# Patient Record
Sex: Female | Born: 2000 | Race: White | Hispanic: No | Marital: Single | Smoking: Never smoker
Health system: Southern US, Community
[De-identification: ages and names within clinical notes are randomized; demographics above are authoritative.]

## PROBLEM LIST (undated history)

## (undated) DIAGNOSIS — D509 Iron deficiency anemia, unspecified: Secondary | ICD-10-CM

## (undated) DIAGNOSIS — D649 Anemia, unspecified: Secondary | ICD-10-CM

---
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
## 2023-02-21 IMAGING — CT CT RENAL STONE PROTOCOL
2 of 4 series · 16 of 46 positions shown, 18 images · non-contrast
Comparison: April 16, 2021.

CLINICAL DATA: Acute lower abdominal pain.



[Series 2: axial st · axial · 0.59mm/px · z∈[-518,-88]mm · 13 of 98 slices shown, 15 images]
[im 6/98  soft-tissue]
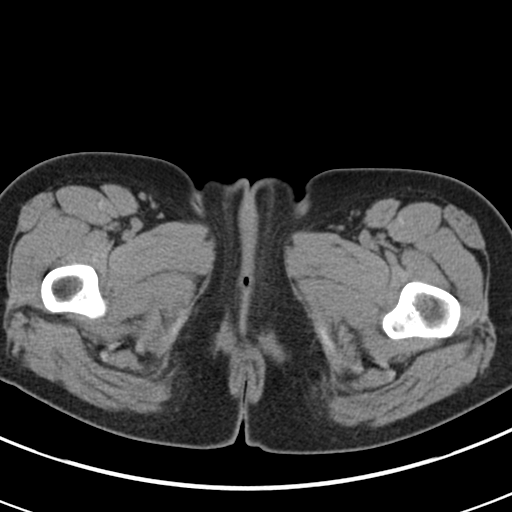
[im 6/98  bone]
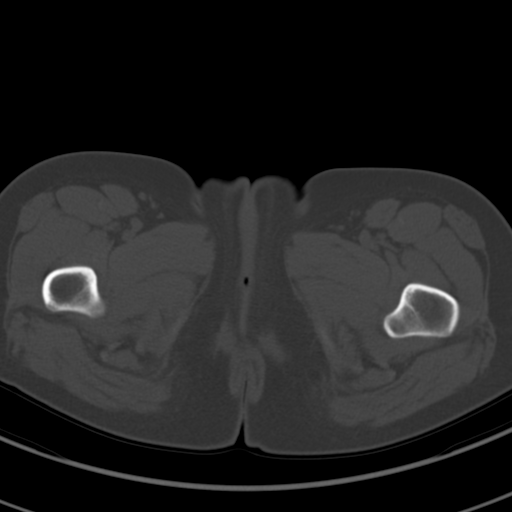
[im 11/98  soft-tissue]
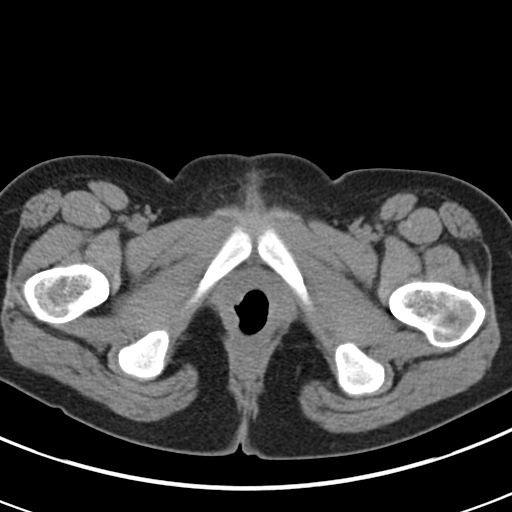
[im 22/98  soft-tissue]
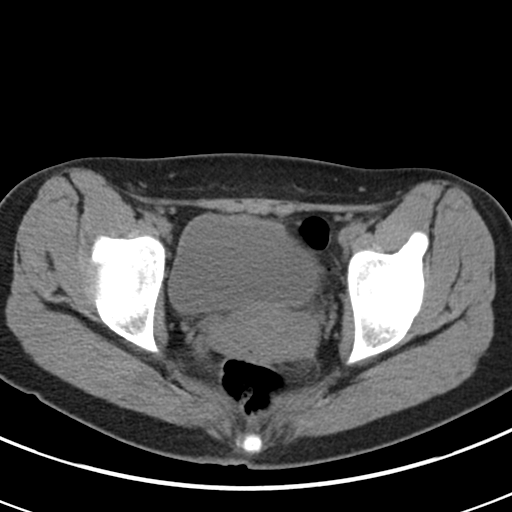
[im 27/98  soft-tissue]
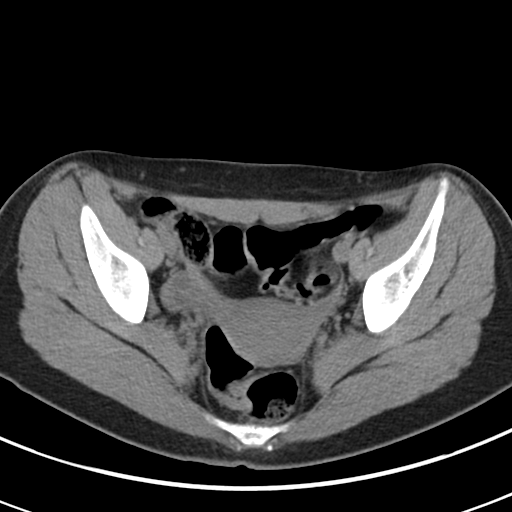
[im 33/98  soft-tissue]
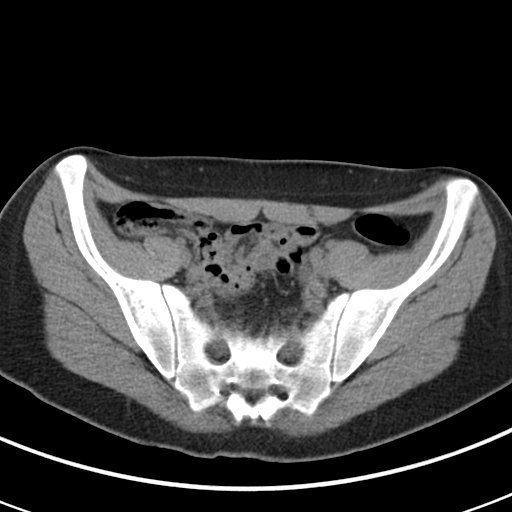
[im 44/98  soft-tissue]
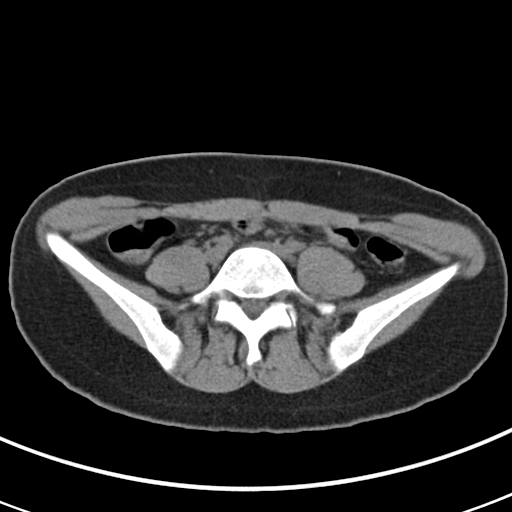
[im 49/98  soft-tissue]
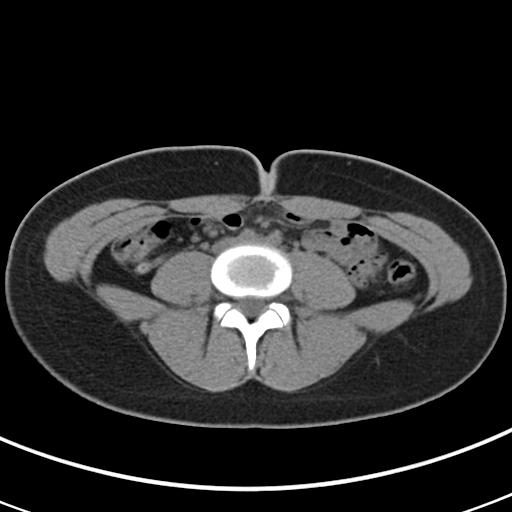
[im 54/98  soft-tissue]
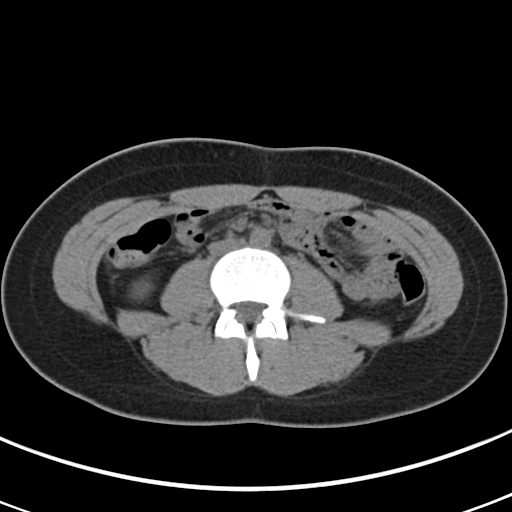
[im 65/98  soft-tissue]
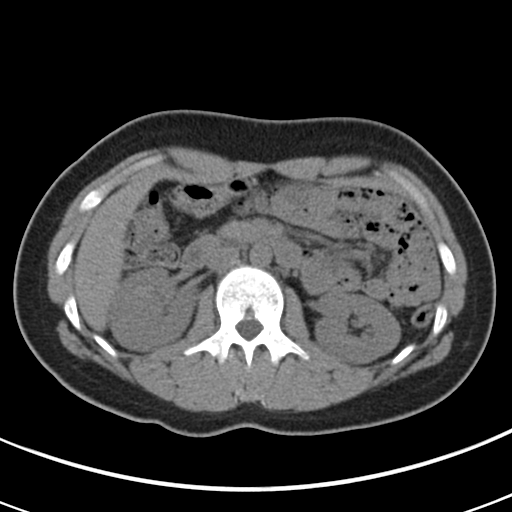
[im 65/98  bone]
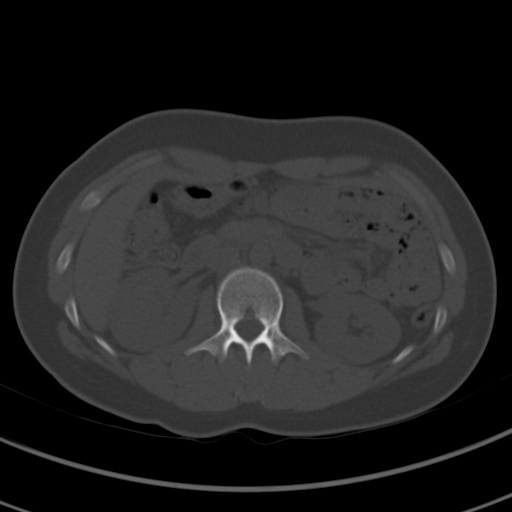
[im 71/98  soft-tissue]
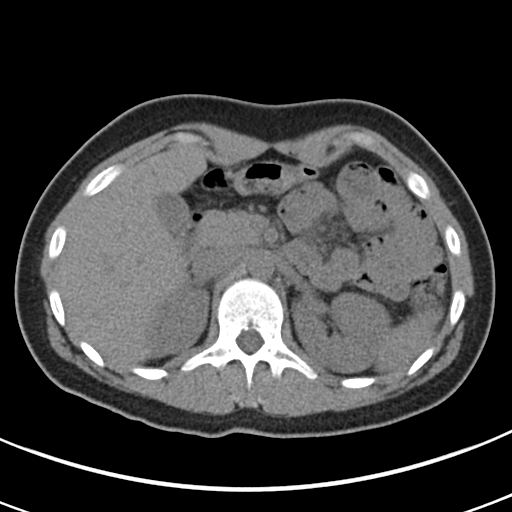
[im 76/98  soft-tissue]
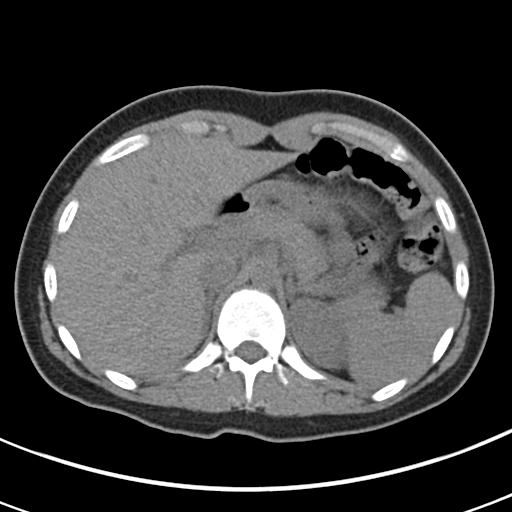
[im 87/98  soft-tissue]
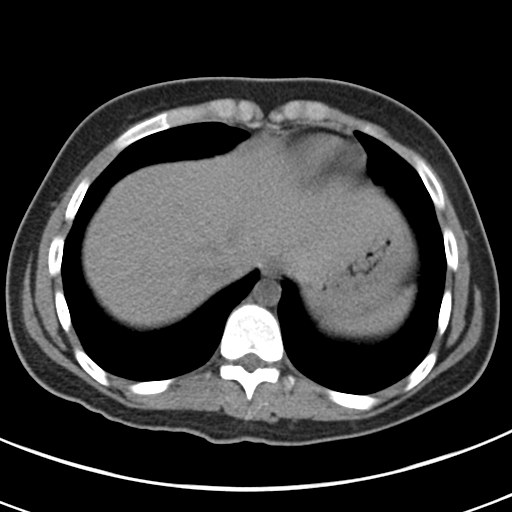
[im 92/98  soft-tissue]
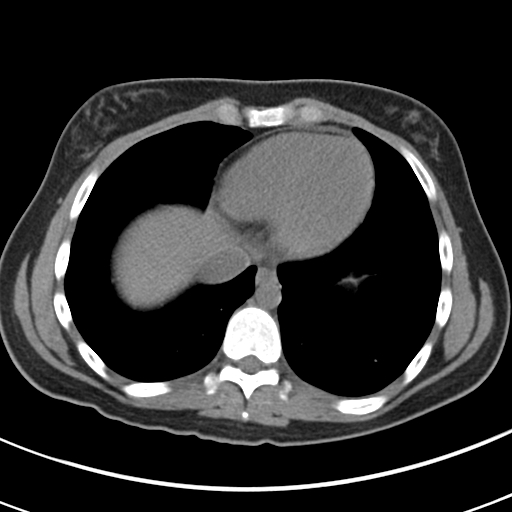

[Series 5: coronal · coronal · 0.66mm/px · 3 of 115 slices shown]
[im 39/115  soft-tissue]
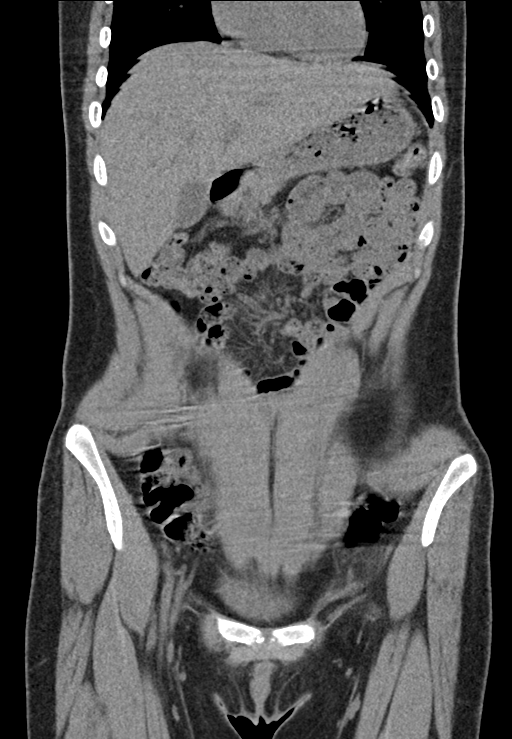
[im 51/115  soft-tissue]
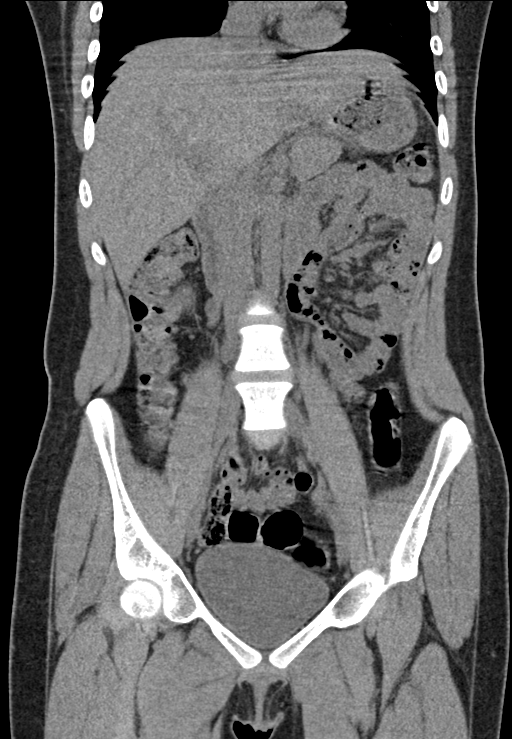
[im 64/115  soft-tissue]
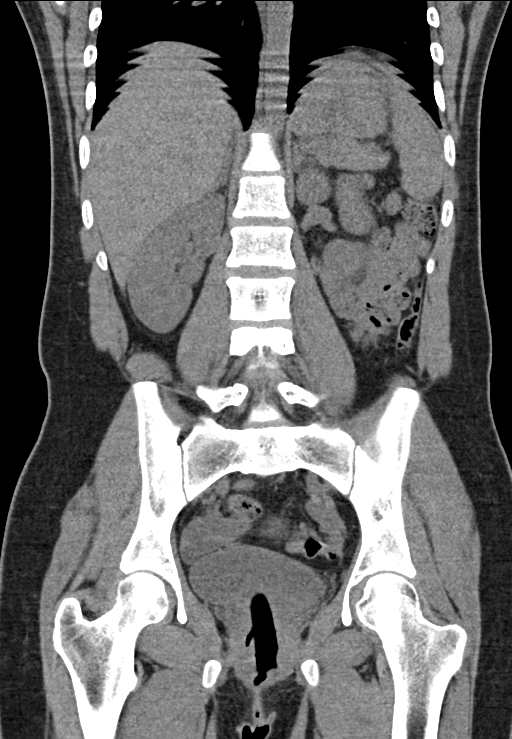

[16 of 46 positions shown; findings below may reference images not displayed]

FINDINGS: Lower chest: No acute abnormality.

Hepatobiliary: No focal liver abnormality is seen. No gallstones,
gallbladder wall thickening, or biliary dilatation.

Pancreas: Unremarkable. No pancreatic ductal dilatation or
surrounding inflammatory changes.

Spleen: Normal in size without focal abnormality.

Adrenals/Urinary Tract: Adrenal glands are unremarkable. Kidneys are
normal, without renal calculi, focal lesion, or hydronephrosis.
Bladder is unremarkable.

Stomach/Bowel: Stomach is within normal limits. Appendix appears
normal. No evidence of bowel wall thickening, distention, or
inflammatory changes.

Vascular/Lymphatic: No significant vascular findings are present. No
enlarged abdominal or pelvic lymph nodes.

Reproductive: Uterus and bilateral adnexa are unremarkable.

Other: No abdominal wall hernia or abnormality. No abdominopelvic
ascites.

Musculoskeletal: No acute or significant osseous findings.
IMPRESSION: No definite abnormality seen in the abdomen or pelvis.

## 2023-03-26 NOTE — Progress Notes (Signed)
 Subjective   HPI:  Bailey Webster is a 23 y.o.  female who presents to the office with:  Chief Complaint  Patient presents with   Fever    Patient presents today with concerns of fever, sweating, cough, and raspy voice. Onset yesterday. Manager positive at home covid/Flu A&B test.    Patient presents today complaining of fevers, chills, sweats, headaches, body aches, congestion, and cough.  She did not feel very good last night, however woke up and felt better so she went to work.  While there today she started developing more symptoms.  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  She has been taking Tylenol and NyQuil as needed.  No ear pain, sore throat, chest pain, dyspnea, wheezing, GI upset.    PMH: Past medical history, Past surgical history, Social history, family history were reviewed as noted in EMR.  Pertinent for:  Past Medical History:  Diagnosis Date   Pott's disease      Medications and allergies reviewed.  ROS: All systems were negative including constitutional, CV, respiratory, GI/GU except for those noted in the HPI.  Objective   Vital Signs: BP 123/82 (BP Location: Left Upper Arm, Patient Position: Sitting)   Pulse 117   Temp 98.4 F (36.9 C) (Temporal)   Resp 16   Ht 5' 6 (1.676 m)   Wt 184 lb (83.5 kg)   SpO2 99%   BMI 29.70 kg/m   Wt Readings from Last 3 Encounters:  03/26/23 184 lb (83.5 kg)  02/18/23 185 lb (83.9 kg)  09/18/22 173 lb 6.4 oz (78.7 kg)   Physical Exam   Assessment/Plan   Orders Placed This Encounter  Procedures   COVID-19, NAA/PCR Nasopharyngeal Swab Nasopharyngeal Swab   POCT Flu A+B (2 Results)    1. Fever, unspecified fever cause   2. Exposure to influenza   3. Viral syndrome     Flu test is negative.  Patient is tachycardic otherwise vitals are stable.  Exam is benign.  Discussed that patient may have tested too early if her  symptoms just started today and could potentially a flu due to her known exposure.  Due to this exposure and symptoms, will treat with 5 days of Tamiflu.  Continue supportive care as needed.  Will follow-up once COVID test results are available.  Return precautions given.  Work note provided excusing patient from work tomorrow 03/27/2023.   Follow up if symptoms worsen or fail to improve.  No future appointments.  Medications at end of visit today:  Current Outpatient Medications:    oseltamivir phosphate (TAMIFLU) 75 mg capsule, Take one capsule (75 mg dose) by mouth 2 (two) times daily for 5 days., Disp: 10 capsule, Rfl: 0  Patient Care Team: Chianne Kline, PA-C as PCP - General (Family Medicine)

## 2024-04-12 ENCOUNTER — Other Ambulatory Visit: Payer: Self-pay

## 2024-04-12 ENCOUNTER — Emergency Department (HOSPITAL_BASED_OUTPATIENT_CLINIC_OR_DEPARTMENT_OTHER): Admission: EM | Admit: 2024-04-12 | Discharge: 2024-04-12 | Disposition: A | Discharge status: Home / Self Care

## 2024-04-12 ENCOUNTER — Emergency Department (HOSPITAL_BASED_OUTPATIENT_CLINIC_OR_DEPARTMENT_OTHER)

## 2024-04-12 ENCOUNTER — Encounter (HOSPITAL_BASED_OUTPATIENT_CLINIC_OR_DEPARTMENT_OTHER): Payer: Self-pay | Admitting: Emergency Medicine

## 2024-04-12 DIAGNOSIS — R112 Nausea with vomiting, unspecified: Secondary | ICD-10-CM

## 2024-04-12 DIAGNOSIS — R1084 Generalized abdominal pain: Secondary | ICD-10-CM

## 2024-04-12 DIAGNOSIS — K529 Noninfective gastroenteritis and colitis, unspecified: Secondary | ICD-10-CM | POA: Insufficient documentation

## 2024-04-12 LAB — COMPREHENSIVE METABOLIC PANEL WITH GFR
ALT: 14 U/L (ref 0–44)
AST: 17 U/L (ref 15–41)
Albumin: 4.9 g/dL (ref 3.5–5.0)
Alkaline Phosphatase: 33 U/L — ABNORMAL LOW (ref 38–126)
Anion gap: 13 (ref 5–15)
BUN: 11 mg/dL (ref 6–20)
CO2: 24 mmol/L (ref 22–32)
Calcium: 9.3 mg/dL (ref 8.9–10.3)
Chloride: 102 mmol/L (ref 98–111)
Creatinine, Ser: 0.82 mg/dL (ref 0.44–1.00)
GFR, Estimated: >60 mL/min
Glucose, Bld: 88 mg/dL (ref 70–99)
Potassium: 4 mmol/L (ref 3.5–5.1)
Sodium: 138 mmol/L (ref 135–145)
Total Bilirubin: 0.6 mg/dL (ref 0.0–1.2)
Total Protein: 8.2 g/dL — ABNORMAL HIGH (ref 6.5–8.1)

## 2024-04-12 LAB — URINALYSIS, ROUTINE W REFLEX MICROSCOPIC
Glucose, UA: NEGATIVE mg/dL
Hgb urine dipstick: NEGATIVE
Ketones, ur: NEGATIVE mg/dL
Leukocytes,Ua: NEGATIVE
Nitrite: NEGATIVE
Protein, ur: 30 mg/dL — AB
Specific Gravity, Urine: >=1.03 (ref 1.005–1.030)
pH: 5.5 (ref 5.0–8.0)

## 2024-04-12 LAB — LIPASE, BLOOD: Lipase: 12 U/L (ref 11–51)

## 2024-04-12 LAB — CBC
HCT: 40.1 % (ref 36.0–46.0)
Hemoglobin: 13.2 g/dL (ref 12.0–15.0)
MCH: 28.3 pg (ref 26.0–34.0)
MCHC: 32.9 g/dL (ref 30.0–36.0)
MCV: 86.1 fL (ref 80.0–100.0)
Platelets: 367 10*3/uL (ref 150–400)
RBC: 4.66 MIL/uL (ref 3.87–5.11)
RDW: 13.6 % (ref 11.5–15.5)
WBC: 8.2 10*3/uL (ref 4.0–10.5)
nRBC: 0 % (ref 0.0–0.2)

## 2024-04-12 LAB — PREGNANCY, URINE: Preg Test, Ur: NEGATIVE

## 2024-04-12 LAB — URINALYSIS, MICROSCOPIC (REFLEX)

## 2024-04-12 MED ORDER — DICYCLOMINE HCL 20 MG PO TABS: 20.0000 mg | ORAL_TABLET | Freq: Two times a day (BID) | ORAL | 0 refills | Status: AC

## 2024-04-12 MED ORDER — ONDANSETRON HCL 4 MG/2ML IJ SOLN
4.0000 mg | Freq: Once | INTRAMUSCULAR | Status: AC
  Administered 2024-04-12: 4 mg via INTRAVENOUS
  Filled 2024-04-12: qty 2

## 2024-04-12 MED ORDER — SODIUM CHLORIDE 0.9 % IV BOLUS
1000.0000 mL | Freq: Once | INTRAVENOUS | Status: AC
  Administered 2024-04-12: 1000 mL via INTRAVENOUS

## 2024-04-12 MED ORDER — ONDANSETRON HCL 4 MG PO TABS: 4.0000 mg | ORAL_TABLET | Freq: Four times a day (QID) | ORAL | 0 refills | Status: AC

## 2024-04-12 MED ORDER — IOHEXOL 300 MG/ML  SOLN
100.0000 mL | Freq: Once | INTRAMUSCULAR | Status: AC | PRN
  Administered 2024-04-12: 100 mL via INTRAVENOUS

## 2024-04-12 MED ORDER — DICYCLOMINE HCL 10 MG/ML IM SOLN
10.0000 mg | Freq: Once | INTRAMUSCULAR | Status: AC
  Administered 2024-04-12: 10 mg via INTRAMUSCULAR
  Filled 2024-04-12: qty 2

## 2024-04-12 MED ORDER — HYDROMORPHONE HCL 1 MG/ML IJ SOLN
1.0000 mg | Freq: Once | INTRAMUSCULAR | Status: AC
  Administered 2024-04-12: 1 mg via INTRAVENOUS
  Filled 2024-04-12: qty 1

## 2024-04-12 NOTE — ED Notes (Signed)
 Patient transported to CT

## 2024-04-12 NOTE — ED Triage Notes (Signed)
 Pt c/o emesis/ diarrhea  since appx 0100 today.  Reports seeing scant amount of blood in both stool and emesis.   Denies known fever, sick contact.   Took tylenol appx 1130.
